# Patient Record
Sex: Female | Born: 1945 | Race: White | Hispanic: No | Marital: Married | Smoking: Never smoker
Health system: Southern US, Community
[De-identification: ages and names within clinical notes are randomized; demographics above are authoritative.]

## PROBLEM LIST (undated history)

## (undated) DIAGNOSIS — I10 Essential (primary) hypertension: Secondary | ICD-10-CM

## (undated) HISTORY — PX: TUBAL LIGATION: SHX77

---

## 2004-11-30 ENCOUNTER — Emergency Department: Payer: Self-pay | Admitting: Internal Medicine

## 2012-05-31 ENCOUNTER — Emergency Department: Payer: Self-pay | Admitting: Unknown Physician Specialty

## 2014-09-02 IMAGING — CR RIGHT TIBIA AND FIBULA - 2 VIEW
1 series · 2 of 2 positions shown · non-contrast
Comparison: none

REASON FOR EXAM: pain, dogbite eval FB or boney involvement
COMMENTS:   May transport without cardiac monitor

RESULT:     Images of the right tibia and fibula demonstrate soft tissue
defect. The bony structures appear to be intact. No radiopaque foreign body
is evident.

[Series 1: x tib-fib ap right · 0.14mm/px · 2 of 2 slices shown]
[im 1/2]
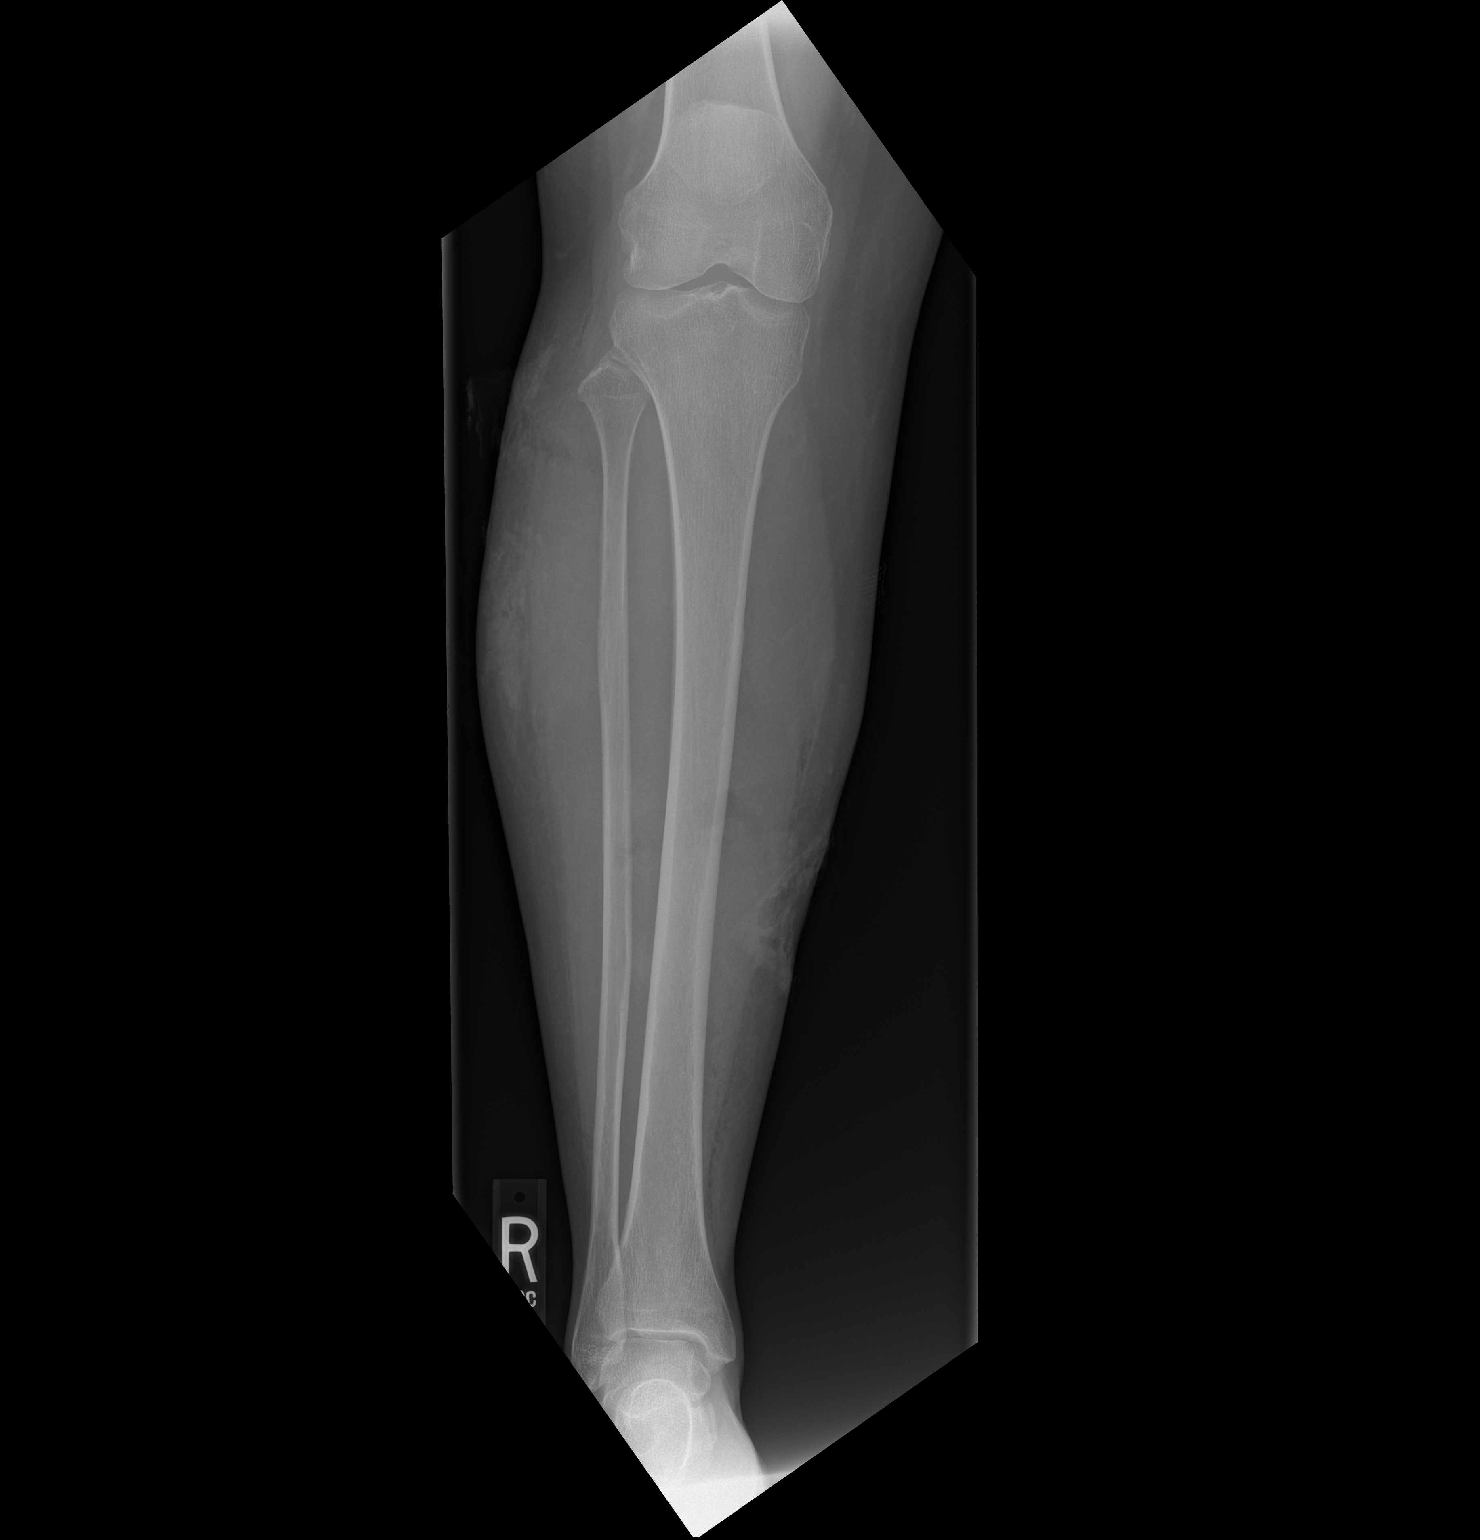
[im 2/2]
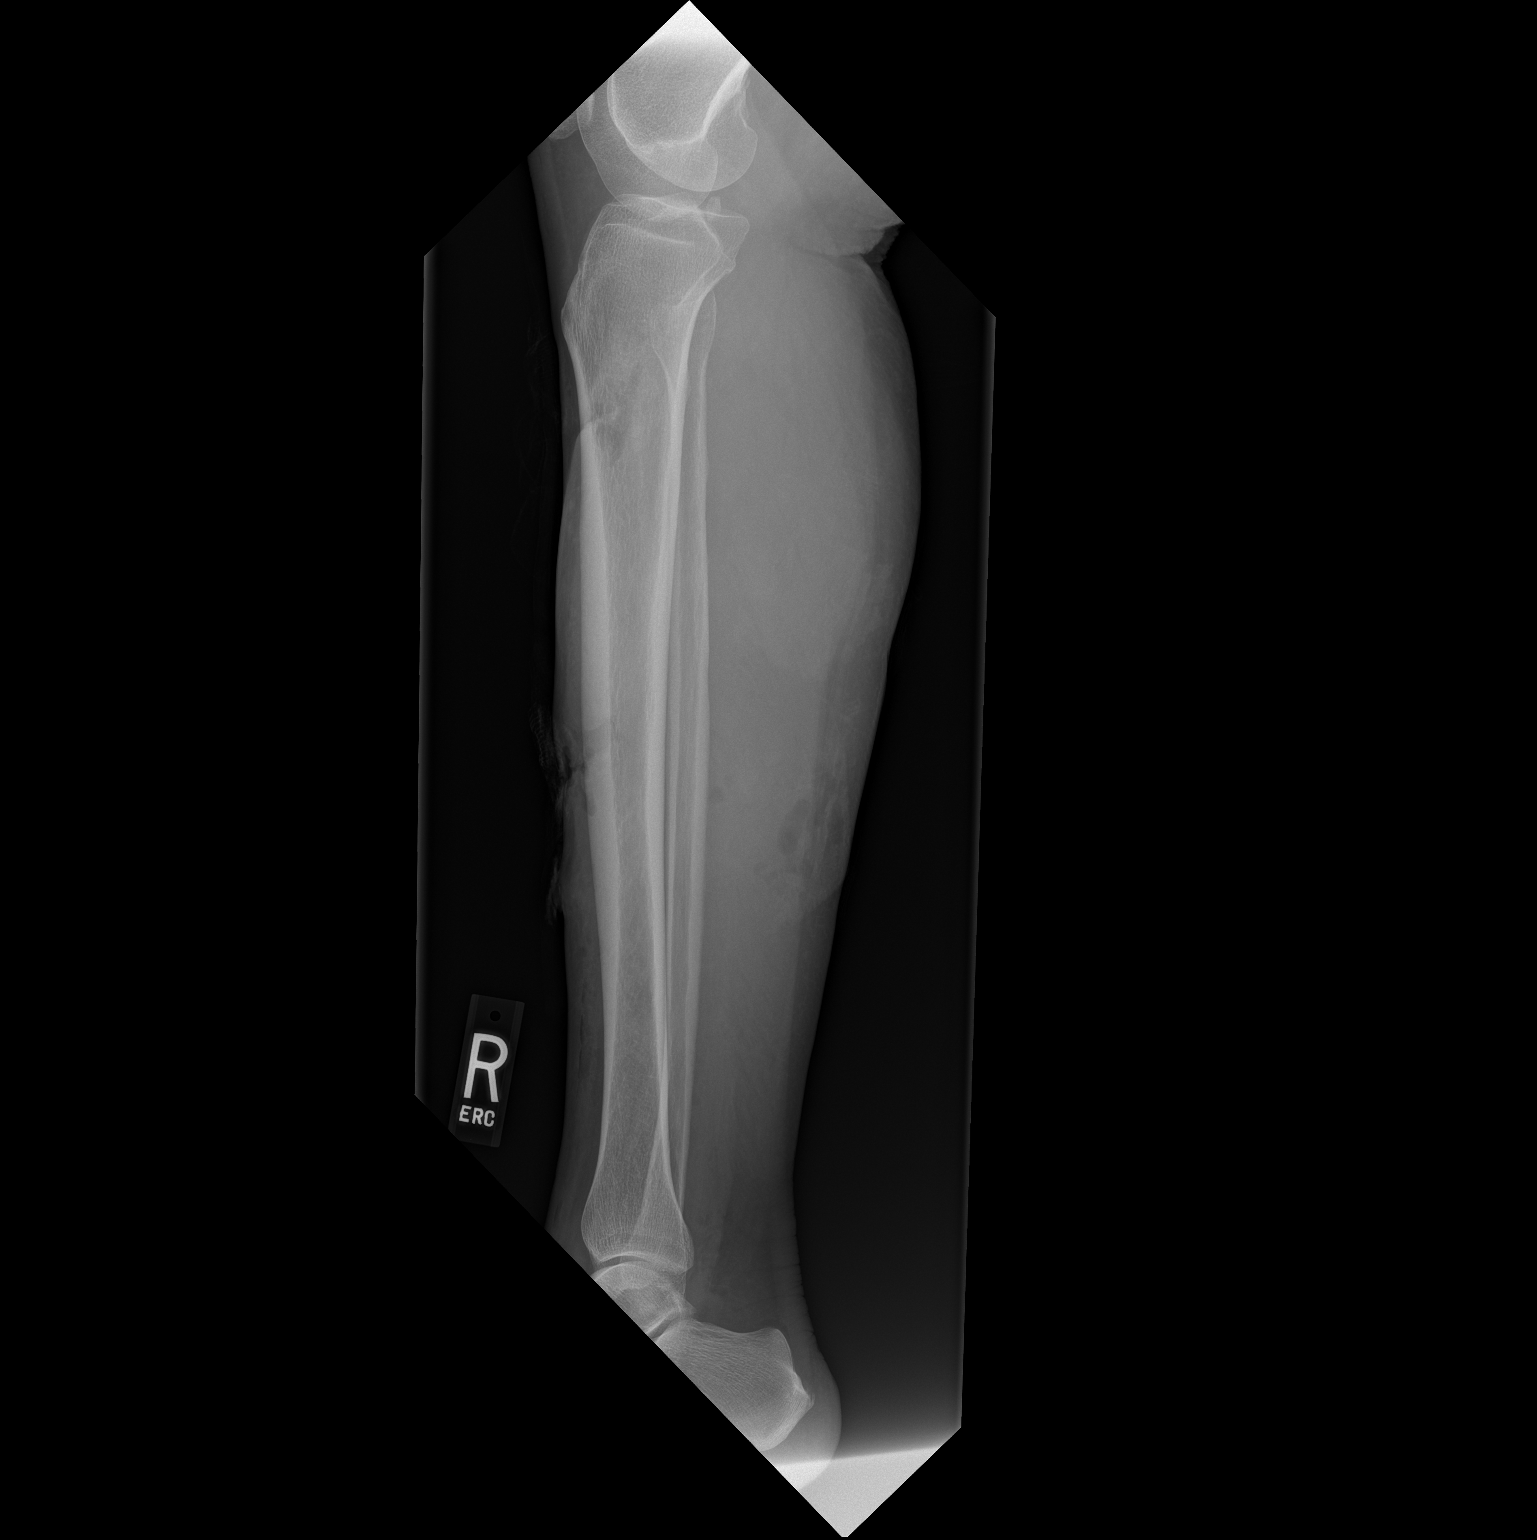

[2 of 2 positions shown; findings below may reference images not displayed]

IMPRESSION: Please see above.

[REDACTED]

## 2016-08-11 ENCOUNTER — Other Ambulatory Visit: Payer: Self-pay | Admitting: Physician Assistant

## 2016-08-11 DIAGNOSIS — Z780 Asymptomatic menopausal state: Secondary | ICD-10-CM

## 2020-12-10 ENCOUNTER — Other Ambulatory Visit: Payer: Self-pay

## 2020-12-10 ENCOUNTER — Ambulatory Visit
Admission: EM | Admit: 2020-12-10 | Discharge: 2020-12-10 | Disposition: A | Discharge status: Home / Self Care | Payer: Medicare Other | Attending: Emergency Medicine | Admitting: Emergency Medicine

## 2020-12-10 DIAGNOSIS — N3001 Acute cystitis with hematuria: Secondary | ICD-10-CM | POA: Diagnosis not present

## 2020-12-10 HISTORY — DX: Essential (primary) hypertension: I10

## 2020-12-10 LAB — URINALYSIS, COMPLETE (UACMP) WITH MICROSCOPIC
Glucose, UA: 100 mg/dL — AB
Nitrite: POSITIVE — AB
Protein, ur: 30 mg/dL — AB
Specific Gravity, Urine: 1.015 (ref 1.005–1.030)
pH: 5 (ref 5.0–8.0)

## 2020-12-10 MED ORDER — CEPHALEXIN 500 MG PO CAPS: 500.0000 mg | ORAL_CAPSULE | Freq: Two times a day (BID) | ORAL | 0 refills | Status: AC

## 2020-12-10 MED ORDER — PHENAZOPYRIDINE HCL 200 MG PO TABS: 200.0000 mg | ORAL_TABLET | Freq: Three times a day (TID) | ORAL | 0 refills | Status: AC | PRN

## 2020-12-10 NOTE — Discharge Instructions (Addendum)
Drink plenty of fluids.  May continue 1000 mg of Tylenol combined with 400-600 ibuprofen 3-4 times a day as needed for pain.  We will contact you if we need to change your antibiotics.  Finish the Keflex, even if you feel better.  The Pyridium will help with your symptoms, it will turn your urine orange.

## 2020-12-10 NOTE — ED Provider Notes (Signed)
HPI  SUBJECTIVE:  Madeline Riley is a 75 y.o. female who presents with a "UTI" starting 5 days ago.  She reports low midline abdominal pain and pressure, dysuria, urgency, frequency, cloudy and odorous urine.  No hematuria.  She reports some very mild vaginal itching, but no odor, discharge.  She reports occasional nausea, no vomiting.  No fevers, back or pelvic pain.  States that she has not been sexually active in 20 years.  She has been taking Azo and ibuprofen with improvement in her symptoms.  No aggravating factors.  No recent antibiotics.  She took ibuprofen within 6 hours of evaluation.  Has a past medical history of frequent UTIs, nephrolithiasis, remote h/o vaginal yeast infections, hypertension.  No history of pyelonephritis, BV, diabetes.,  Kidney disease.  PMD: Bernestine Amass once a year.  Past Medical History:  Diagnosis Date   Hypertension     Past Surgical History:  Procedure Laterality Date   TUBAL LIGATION      Family History  Problem Relation Age of Onset   Stroke Mother    Stroke Father     Social History   Tobacco Use   Smoking status: Never   Smokeless tobacco: Never  Vaping Use   Vaping Use: Never used  Substance Use Topics   Alcohol use: Yes    Comment: occasionally   Drug use: Never    No current facility-administered medications for this encounter.  Current Outpatient Medications:    cephALEXin (KEFLEX) 500 MG capsule, Take 1 capsule (500 mg total) by mouth 2 (two) times daily., Disp: 14 capsule, Rfl: 0   FLUoxetine (PROZAC) 20 MG capsule, Take 20 mg by mouth daily., Disp: , Rfl:    lisinopril-hydrochlorothiazide (ZESTORETIC) 20-12.5 MG tablet, Take 1 tablet by mouth daily., Disp: , Rfl:    phenazopyridine (PYRIDIUM) 200 MG tablet, Take 1 tablet (200 mg total) by mouth 3 (three) times daily as needed for pain., Disp: 6 tablet, Rfl: 0   SUMAtriptan (IMITREX) 50 MG tablet, Take by mouth., Disp: , Rfl:   Allergies  Allergen Reactions   Demerol  [Meperidine Hcl] Nausea And Vomiting     ROS  As noted in HPI.   Physical Exam  BP 105/69 (BP Location: Right Arm)   Pulse 71   Temp 98.6 F (37 C) (Oral)   Resp 18   Ht 5\' 4"  (1.626 m)   Wt 63.5 kg   SpO2 96%   BMI 24.03 kg/m   Constitutional: Well developed, well nourished, no acute distress Eyes:  EOMI, conjunctiva normal bilaterally HENT: Normocephalic, atraumatic,mucus membranes moist Respiratory: Normal inspiratory effort Cardiovascular: Normal rate GI: nondistended.  Positive suprapubic tenderness.  No flank tenderness Back: No CVAT skin: No rash, skin intact Musculoskeletal: no deformities Neurologic: Alert & oriented x 3, no focal neuro deficits Psychiatric: Speech and behavior appropriate   ED Course   Medications - No data to display  Orders Placed This Encounter  Procedures   Urine Culture    keflex    Standing Status:   Standing    Number of Occurrences:   1    Order Specific Question:   Indication    Answer:   Dysuria   Urinalysis, Complete w Microscopic Urine, Clean Catch    Standing Status:   Standing    Number of Occurrences:   1    Results for orders placed or performed during the hospital encounter of 12/10/20 (from the past 24 hour(s))  Urinalysis, Complete w Microscopic Urine, Clean Catch  Status: Abnormal   Collection Time: 12/10/20  8:26 AM  Result Value Ref Range   Color, Urine AMBER (A) YELLOW   APPearance HAZY (A) CLEAR   Specific Gravity, Urine 1.015 1.005 - 1.030   pH 5.0 5.0 - 8.0   Glucose, UA 100 (A) NEGATIVE mg/dL   Hgb urine dipstick MODERATE (A) NEGATIVE   Bilirubin Urine SMALL (A) NEGATIVE   Ketones, ur TRACE (A) NEGATIVE mg/dL   Protein, ur 30 (A) NEGATIVE mg/dL   Nitrite POSITIVE (A) NEGATIVE   Leukocytes,Ua SMALL (A) NEGATIVE   Squamous Epithelial / LPF 0-5 0 - 5   WBC, UA 6-10 0 - 5 WBC/hpf   RBC / HPF 11-20 0 - 5 RBC/hpf   Bacteria, UA MANY (A) NONE SEEN   Granular Casts, UA PRESENT    No results  found.  ED Clinical Impression  1. Acute cystitis with hematuria      ED Assessment/Plan  No urine culture results available in epic or Care Everywhere.  Patient declined primary care list/referral.  She states that she is content with going to [facility] once a year for  blood pressure medications.  UA consistent with UTI-positive nitrites, small esterase, many bacteria.  She also has some hematuria, bilirubin and proteinuria.  Sending this off for culture to confirm antibiotic choice.  Will send home with Keflex and Pyridium.  Follow-up with [facility] as needed.  ER return precautions given.  Discussed labs,  MDM, treatment plan, and plan for follow-up with patient. Discussed sn/sx that should prompt return to the ED. patient agrees with plan.   Meds ordered this encounter  Medications   phenazopyridine (PYRIDIUM) 200 MG tablet    Sig: Take 1 tablet (200 mg total) by mouth 3 (three) times daily as needed for pain.    Dispense:  6 tablet    Refill:  0   cephALEXin (KEFLEX) 500 MG capsule    Sig: Take 1 capsule (500 mg total) by mouth 2 (two) times daily.    Dispense:  14 capsule    Refill:  0       *This clinic note was created using Scientist, clinical (histocompatibility and immunogenetics). Therefore, there may be occasional mistakes despite careful proofreading.  ?    Domenick Gong, MD 12/12/20 [phone]

## 2020-12-10 NOTE — ED Triage Notes (Signed)
Patient states that she has been having urinary frequency, urgency and pain. States that she also notices pain in her bladder when taking a deep breath. States that this started on Thursday. Has taken AZO Saturday and Sunday. States that she does think that she ran a fever over the weekend.

## 2020-12-13 LAB — URINE CULTURE: Culture: >=100000 — AB

## 2022-05-08 ENCOUNTER — Other Ambulatory Visit: Payer: Self-pay | Admitting: Family Medicine

## 2022-05-08 DIAGNOSIS — Z780 Asymptomatic menopausal state: Secondary | ICD-10-CM

## 2022-05-19 ENCOUNTER — Other Ambulatory Visit: Payer: Self-pay | Admitting: Family Medicine

## 2022-05-19 DIAGNOSIS — R0602 Shortness of breath: Secondary | ICD-10-CM

## 2022-05-26 ENCOUNTER — Ambulatory Visit
Admission: RE | Admit: 2022-05-26 | Discharge: 2022-05-26 | Disposition: A | Discharge status: Home / Self Care | Payer: PPO | Source: Ambulatory Visit | Attending: Family Medicine | Admitting: Family Medicine

## 2022-05-26 DIAGNOSIS — R0602 Shortness of breath: Secondary | ICD-10-CM

## 2022-05-26 DIAGNOSIS — I10 Essential (primary) hypertension: Secondary | ICD-10-CM | POA: Diagnosis not present

## 2022-05-26 LAB — ECHOCARDIOGRAM COMPLETE
AR max vel: 2.3 cm2
AV Area VTI: 2.35 cm2
AV Area mean vel: 2.42 cm2
AV Mean grad: 4.5 mmHg
AV Peak grad: 7.2 mmHg
Ao pk vel: 1.35 m/s
Area-P 1/2: 2.36 cm2
S' Lateral: 2.5 cm

## 2022-05-26 NOTE — Progress Notes (Signed)
*  PRELIMINARY RESULTS* Echocardiogram 2D Echocardiogram has been performed.  Sherrie Sport 05/26/2022, 11:32 AM

## 2022-11-20 DIAGNOSIS — H524 Presbyopia: Secondary | ICD-10-CM | POA: Diagnosis not present

## 2022-12-01 ENCOUNTER — Ambulatory Visit
Admission: EM | Admit: 2022-12-01 | Discharge: 2022-12-01 | Disposition: A | Discharge status: Home / Self Care | Payer: Medicare HMO | Attending: Emergency Medicine | Admitting: Emergency Medicine

## 2022-12-01 DIAGNOSIS — U071 COVID-19: Secondary | ICD-10-CM | POA: Insufficient documentation

## 2022-12-01 LAB — SARS CORONAVIRUS 2 BY RT PCR: SARS Coronavirus 2 by RT PCR: POSITIVE — AB

## 2022-12-01 MED ORDER — MOLNUPIRAVIR EUA 200MG CAPSULE: 4.0000 | ORAL_CAPSULE | Freq: Two times a day (BID) | ORAL | 0 refills | Status: AC

## 2022-12-01 NOTE — ED Triage Notes (Signed)
Sx started yesterday. Cough,runny nose, body aches. Took Ibu advil cold and sinus and IBU.

## 2022-12-01 NOTE — ED Provider Notes (Signed)
[facility]    CSN: [id number] Arrival date & time: 12/01/22  1445      History   Chief Complaint Chief Complaint  Patient presents with   Nasal Congestion   Cough   Generalized Body Aches    HPI Madeline Riley is a 77 y.o. female.   Patient presents for evaluation of subjective fever, chills, body aches, nasal congestion, rhinorrhea, nonproductive cough and sinus pain and pressure cheeks present for 1 day.  Fever and chills have improved today.  No known sick contacts but works at [employer] and comes in contact with various people.  Decreased appetite but tolerating both food and fluids.  Has attempted use of ibuprofen and Advil Cold and Sinus which has been somewhat helpful.  Denies respiratory history.  Non-smoker.  Past Medical History:  Diagnosis Date   Hypertension     There are no problems to display for this patient.   Past Surgical History:  Procedure Laterality Date   TUBAL LIGATION      OB History   No obstetric history on file.      Home Medications    Prior to Admission medications   Medication Sig Start Date End Date Taking? Authorizing Provider  FLUoxetine (PROZAC) 20 MG capsule Take 20 mg by mouth daily. 10/02/20  Yes [provider]  lisinopril-hydrochlorothiazide (ZESTORETIC) 20-12.5 MG tablet Take 1 tablet by mouth daily. 10/02/20  Yes [provider]  phenazopyridine (PYRIDIUM) 200 MG tablet Take 1 tablet (200 mg total) by mouth 3 (three) times daily as needed for pain. 12/10/20  Yes Domenick Gong, MD  SUMAtriptan (IMITREX) 50 MG tablet Take by mouth. 10/02/20  Yes [provider]  cephALEXin (KEFLEX) 500 MG capsule Take 1 capsule (500 mg total) by mouth 2 (two) times daily. 12/10/20   Domenick Gong, MD    Family History Family History  Problem Relation Age of Onset   Stroke Mother    Stroke Father     Social History Social History   Tobacco Use   Smoking status: Never   Smokeless tobacco: Never   Vaping Use   Vaping status: Never Used  Substance Use Topics   Alcohol use: Yes    Comment: occasionally   Drug use: Never     Allergies   Demerol [meperidine hcl]   Review of Systems Review of Systems  Constitutional:  Positive for chills and fever. Negative for activity change, appetite change, diaphoresis, fatigue and unexpected weight change.  HENT:  Positive for congestion, rhinorrhea, sinus pressure and sinus pain. Negative for dental problem, drooling, ear discharge, ear pain, facial swelling, hearing loss, mouth sores, nosebleeds, postnasal drip, sneezing, sore throat, tinnitus, trouble swallowing and voice change.   Respiratory:  Positive for cough. Negative for apnea, choking, chest tightness, shortness of breath, wheezing and stridor.   Cardiovascular: Negative.   Gastrointestinal: Negative.   Musculoskeletal:  Positive for myalgias. Negative for arthralgias, back pain, gait problem, joint swelling, neck pain and neck stiffness.  Neurological: Negative.      Physical Exam Triage Vital Signs ED Triage Vitals [12/01/22 1543]  Encounter Vitals Group     BP (!) 94/53     Systolic BP Percentile      Diastolic BP Percentile      Pulse Rate 71     Resp 18     Temp (!) 97.5 F (36.4 C)     Temp Source Oral     SpO2 99 %     Weight 145  lb (65.8 kg)     Height      Head Circumference      Peak Flow      Pain Score      Pain Loc      Pain Education      Exclude from Growth Chart    No data found.  Updated Vital Signs BP (!) 94/53 (BP Location: Left Arm)   Pulse 71   Temp (!) 97.5 F (36.4 C) (Oral)   Resp 18   Wt 145 lb (65.8 kg)   SpO2 99%   BMI 24.89 kg/m   Visual Acuity Right Eye Distance:   Left Eye Distance:   Bilateral Distance:    Right Eye Near:   Left Eye Near:    Bilateral Near:     Physical Exam Constitutional:      Appearance: Normal appearance.  HENT:     Head: Normocephalic.     Right Ear: Tympanic membrane, ear canal and  external ear normal.     Left Ear: Tympanic membrane, ear canal and external ear normal.     Nose: Congestion and rhinorrhea present.     Mouth/Throat:     Mouth: Mucous membranes are moist.     Pharynx: No posterior oropharyngeal erythema.  Cardiovascular:     Rate and Rhythm: Normal rate and regular rhythm.     Pulses: Normal pulses.     Heart sounds: Normal heart sounds.  Pulmonary:     Effort: Pulmonary effort is normal.     Breath sounds: Normal breath sounds.  Musculoskeletal:        General: Normal range of motion.     Cervical back: Normal range of motion and neck supple.  Skin:    General: Skin is warm and dry.  Neurological:     General: No focal deficit present.     Mental Status: She is alert and oriented to person, place, and time. Mental status is at baseline.      UC Treatments / Results  Labs (all labs ordered are listed, but only abnormal results are displayed) Labs Reviewed  SARS CORONAVIRUS 2 BY RT PCR - Abnormal; Notable for the following components:      Result Value   SARS Coronavirus 2 by RT PCR POSITIVE (*)    All other components within normal limits    EKG   Radiology No results found.  Procedures Procedures (including critical care time)  Medications Ordered in UC Medications - No data to display  Initial Impression / Assessment and Plan / UC Course  I have reviewed the triage vital signs and the nursing notes.  Pertinent labs & imaging results that were available during my care of the patient were reviewed by me and considered in my medical decision making (see chart for details).  COVID-19  PCR positive, discussed findings, discussed quarantine per CDC, prescription given for antivirals that she will take to her local pharmacy as they do not except electronic prescriptions, discussed administration, declined Paxlovid, does not have up-to-date GFR and does not want to get blood work, recommended use of over-the-counter medications for  supportive care with follow-up as needed  Blood pressure in triage 94/53, at this time asymptomatic for any signs of hypotension, stable for outpatient management, patient endorses that blood pressures typically lower, systolically in the 80s, but she does not measure blood pressure consistently, has been taking blood pressure medication daily as directed without missing dosages, goes to PCP appointment yearly to get medication  renewed,  discussed that this is abnormally low, endorses that intermittently she experiences dizziness but denies any syncopal events, advised to monitor blood pressure at least 2-3 times weekly and that if systolic blood pressure is below 90 or diastolic is below 50 that she is to notify her PCP and if she is to have symptoms at any point that she is not to take blood pressure medication, verbalized understanding Final Clinical Impressions(s) / UC Diagnoses   Final diagnoses:  COVID-19     Discharge Instructions      COVID-19 is a virus and should steadily improve in time, with most viruses it can take up to 7 to 10 days before you truly start to see a turnaround however things will get better  Begin Paxlovid, take every morning and every evening for 5 days, this is the antiviral which helps to reduce the severity of symptoms and ideally the timeline that you are sick, does not fully take away illness  Per the CDC he will need to quarantine if experiencing fever, if no fever you may continue activity wearing mask until symptoms have gone away  In addition to the antiviral medicine you may use any over-the-counter medicine to help calm your symptoms  Please seek evaluation if having any trouble breathing    You can take Tylenol and/or Ibuprofen as needed for fever reduction and pain relief.   For cough: honey 1/2 to 1 teaspoon (you can dilute the honey in water or another fluid).  You can also use guaifenesin and dextromethorphan for cough. You can use a humidifier  for chest congestion and cough.  If you don't have a humidifier, you can sit in the bathroom with the hot shower running.      For sore throat: try warm salt water gargles, cepacol lozenges, throat spray, warm tea or water with lemon/honey, popsicles or ice, or OTC cold relief medicine for throat discomfort.   For congestion: take a daily anti-histamine like Zyrtec, Claritin, and a oral decongestant, such as pseudoephedrine.  You can also use Flonase 1-2 sprays in each nostril daily.   It is important to stay hydrated: drink plenty of fluids (water, gatorade/powerade/pedialyte, juices, or teas) to keep your throat moisturized and help further relieve irritation/discomfort.    ED Prescriptions   None    PDMP not reviewed this encounter.   Valinda Hoar, NP 12/01/22 [phone]

## 2022-12-01 NOTE — Discharge Instructions (Addendum)
COVID-19 is a virus and should steadily improve in time, with most viruses it can take up to 7 to 10 days before you truly start to see a turnaround however things will get better  Begin antiviral , take every morning and every evening for 5 days, this is the antiviral which helps to reduce the severity of symptoms and ideally the timeline that you are sick, does not fully take away illness  Per the CDC he will need to quarantine if experiencing fever, if no fever you may continue activity wearing mask until symptoms have gone away  Please monitor your blood pressure closely, check at least 2-3 times weekly (once a day when done) if the top number is less than 90 and/or the bottom number is less than 50, please notify your primary doctor as your blood pressure is too low, if you are experiencing dizziness, lightheadedness do not take your blood pressure medicine as this can cause you to pass out  In addition to the antiviral medicine you may use any over-the-counter medicine to help calm your symptoms  Please seek evaluation if having any trouble breathing    You can take Tylenol and/or Ibuprofen as needed for fever reduction and pain relief.   For cough: honey 1/2 to 1 teaspoon (you can dilute the honey in water or another fluid).  You can also use guaifenesin and dextromethorphan for cough. You can use a humidifier for chest congestion and cough.  If you don't have a humidifier, you can sit in the bathroom with the hot shower running.      For sore throat: try warm salt water gargles, cepacol lozenges, throat spray, warm tea or water with lemon/honey, popsicles or ice, or OTC cold relief medicine for throat discomfort.   For congestion: take a daily anti-histamine like Zyrtec, Claritin, and a oral decongestant, such as pseudoephedrine.  You can also use Flonase 1-2 sprays in each nostril daily.   It is important to stay hydrated: drink plenty of fluids (water, gatorade/powerade/pedialyte,  juices, or teas) to keep your throat moisturized and help further relieve irritation/discomfort.

## 2023-11-18 ENCOUNTER — Other Ambulatory Visit: Payer: Self-pay | Admitting: Internal Medicine

## 2023-11-18 DIAGNOSIS — Z780 Asymptomatic menopausal state: Secondary | ICD-10-CM
